# Patient Record
Sex: Male | Born: 1985 | Race: White | Hispanic: No | Marital: Single | Smoking: Never smoker
Health system: Southern US, Community
[De-identification: ages and names within clinical notes are randomized; demographics above are authoritative.]

## PROBLEM LIST (undated history)

## (undated) DIAGNOSIS — S069X9A Unspecified intracranial injury with loss of consciousness of unspecified duration, initial encounter: Secondary | ICD-10-CM

## (undated) DIAGNOSIS — D62 Acute posthemorrhagic anemia: Secondary | ICD-10-CM

## (undated) DIAGNOSIS — F909 Attention-deficit hyperactivity disorder, unspecified type: Secondary | ICD-10-CM

## (undated) DIAGNOSIS — S36039A Unspecified laceration of spleen, initial encounter: Secondary | ICD-10-CM

## (undated) DIAGNOSIS — S020XXA Fracture of vault of skull, initial encounter for closed fracture: Secondary | ICD-10-CM

## (undated) DIAGNOSIS — S069XAA Unspecified intracranial injury with loss of consciousness status unknown, initial encounter: Secondary | ICD-10-CM

## (undated) DIAGNOSIS — I6200 Nontraumatic subdural hemorrhage, unspecified: Secondary | ICD-10-CM

## (undated) HISTORY — DX: Fracture of vault of skull, initial encounter for closed fracture: S02.0XXA

## (undated) HISTORY — DX: Unspecified laceration of spleen, initial encounter: S36.039A

## (undated) HISTORY — DX: Unspecified intracranial injury with loss of consciousness status unknown, initial encounter: S06.9XAA

## (undated) HISTORY — DX: Unspecified intracranial injury with loss of consciousness of unspecified duration, initial encounter: S06.9X9A

## (undated) HISTORY — DX: Attention-deficit hyperactivity disorder, unspecified type: F90.9

## (undated) HISTORY — DX: Nontraumatic subdural hemorrhage, unspecified: I62.00

## (undated) HISTORY — DX: Acute posthemorrhagic anemia: D62

---

## 2005-05-06 ENCOUNTER — Emergency Department: Payer: Self-pay | Admitting: Emergency Medicine

## 2005-06-01 ENCOUNTER — Emergency Department: Payer: Self-pay | Admitting: General Practice

## 2006-07-27 ENCOUNTER — Emergency Department: Payer: Self-pay | Admitting: General Practice

## 2007-12-07 ENCOUNTER — Emergency Department (HOSPITAL_COMMUNITY): Admission: EM | Admit: 2007-12-07 | Discharge: 2007-12-07 | Payer: Self-pay | Admitting: Emergency Medicine

## 2009-06-29 ENCOUNTER — Emergency Department (HOSPITAL_COMMUNITY): Admission: EM | Admit: 2009-06-29 | Discharge: 2009-06-29 | Payer: Self-pay | Admitting: Emergency Medicine

## 2009-07-24 ENCOUNTER — Inpatient Hospital Stay (HOSPITAL_COMMUNITY): Admission: AC | Admit: 2009-07-24 | Discharge: 2009-07-31 | Payer: Self-pay

## 2009-07-30 ENCOUNTER — Ambulatory Visit: Payer: Self-pay | Admitting: Physical Medicine & Rehabilitation

## 2009-07-31 ENCOUNTER — Inpatient Hospital Stay (HOSPITAL_COMMUNITY)
Admission: RE | Admit: 2009-07-31 | Discharge: 2009-08-16 | Payer: Self-pay | Admitting: Physical Medicine & Rehabilitation

## 2009-07-31 ENCOUNTER — Ambulatory Visit: Payer: Self-pay | Admitting: Physical Medicine & Rehabilitation

## 2009-08-11 ENCOUNTER — Ambulatory Visit: Payer: Self-pay | Admitting: Physical Medicine & Rehabilitation

## 2009-08-13 ENCOUNTER — Ambulatory Visit: Payer: Self-pay | Admitting: Psychology

## 2009-09-14 ENCOUNTER — Encounter
Admission: RE | Admit: 2009-09-14 | Discharge: 2009-09-19 | Payer: Self-pay | Admitting: Physical Medicine & Rehabilitation

## 2009-09-19 ENCOUNTER — Ambulatory Visit: Payer: Self-pay | Admitting: Physical Medicine & Rehabilitation

## 2009-10-04 ENCOUNTER — Ambulatory Visit: Payer: Self-pay | Admitting: Psychology

## 2009-12-10 ENCOUNTER — Encounter
Admission: RE | Admit: 2009-12-10 | Discharge: 2009-12-10 | Payer: Self-pay | Admitting: Physical Medicine & Rehabilitation

## 2010-08-18 ENCOUNTER — Encounter: Payer: Self-pay | Admitting: Physical Medicine & Rehabilitation

## 2010-10-10 IMAGING — CR DG CERVICAL SPINE FLEX&EXT ONLY
2 series · 2 of 2 positions shown · non-contrast
Comparison: 07/24/2009.

CLINICAL DATA: 23-year-old male status post trauma with concussion.

CERVICAL SPINE - FLEXION AND EXTENSION VIEWS ONLY

[w c-spine flexion]
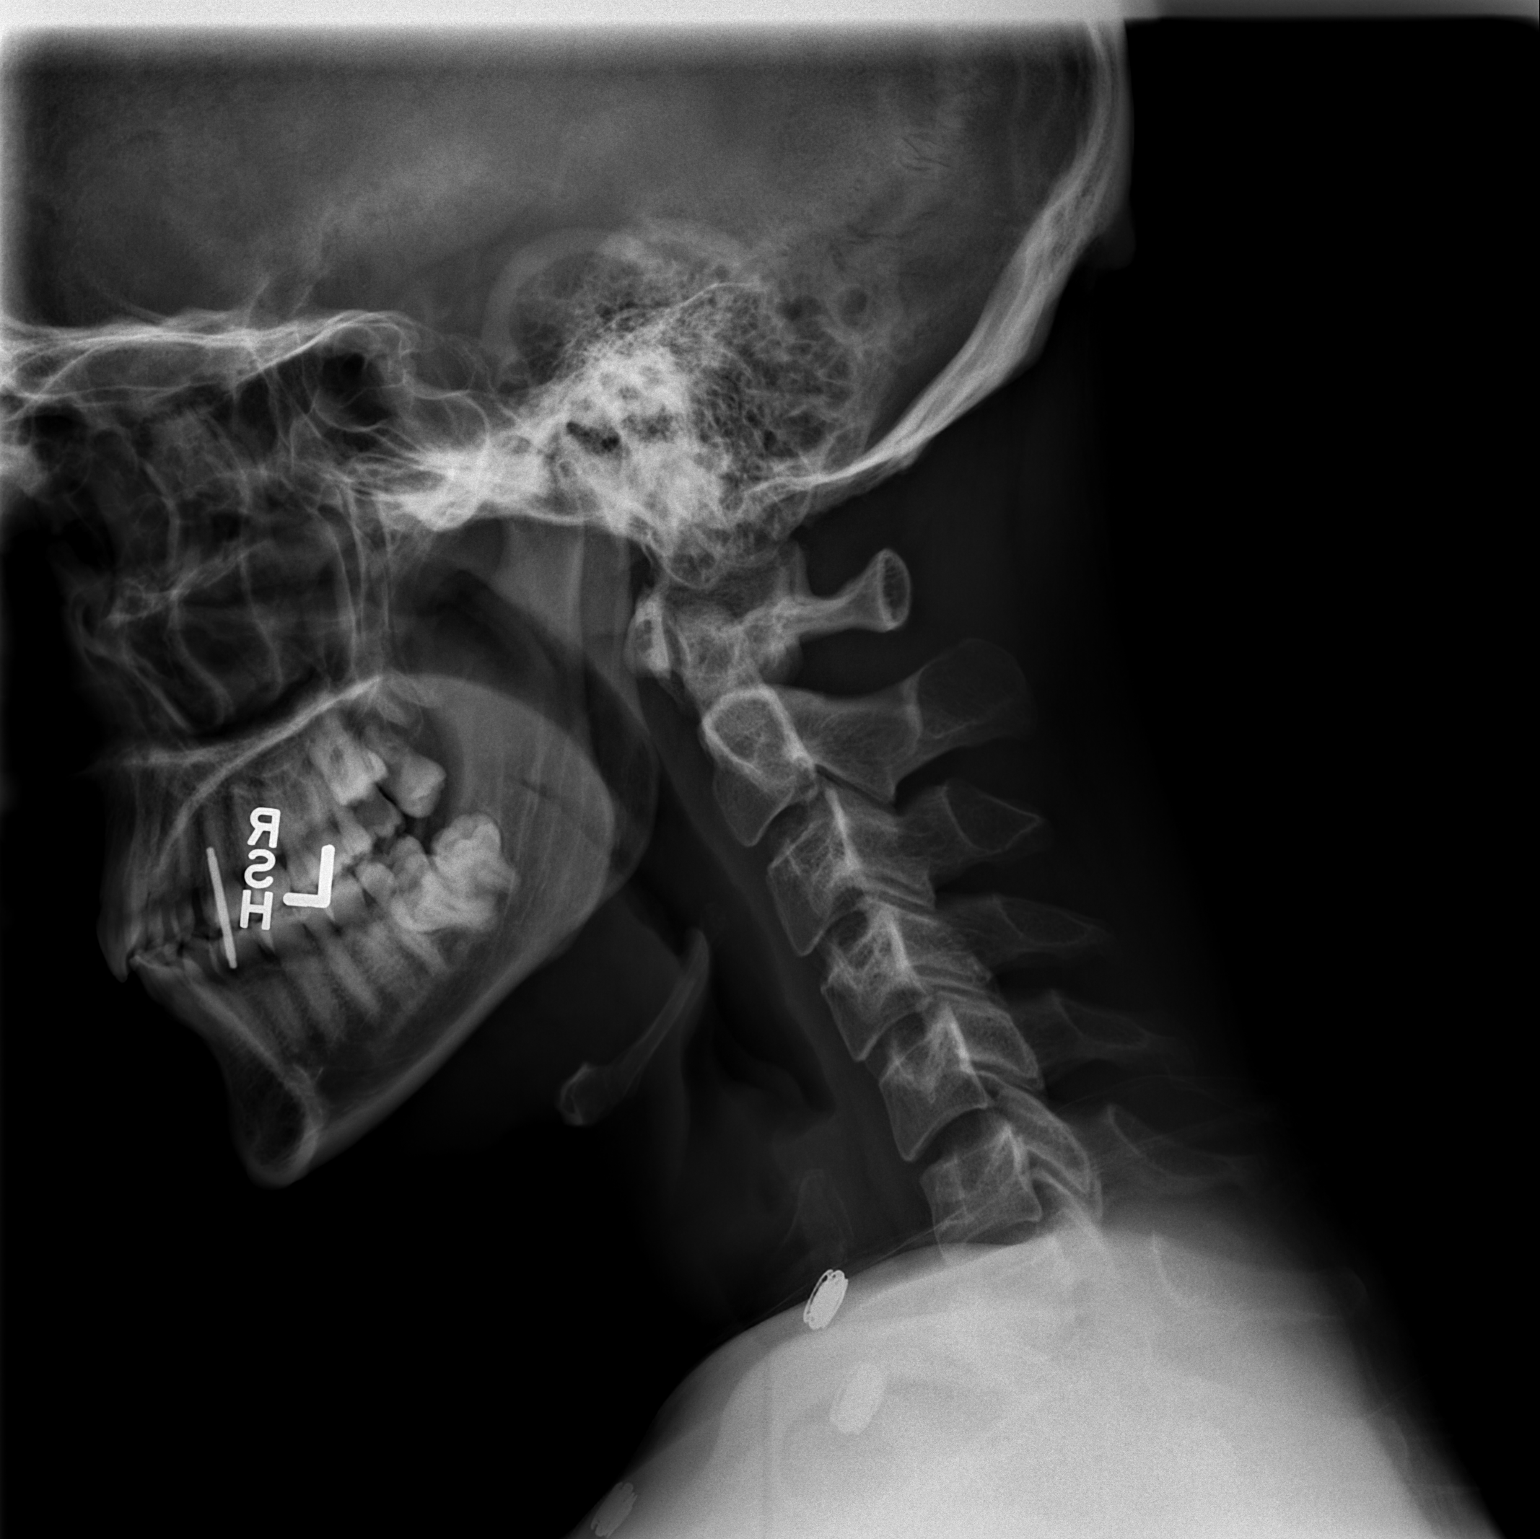

[w c-spine extension]
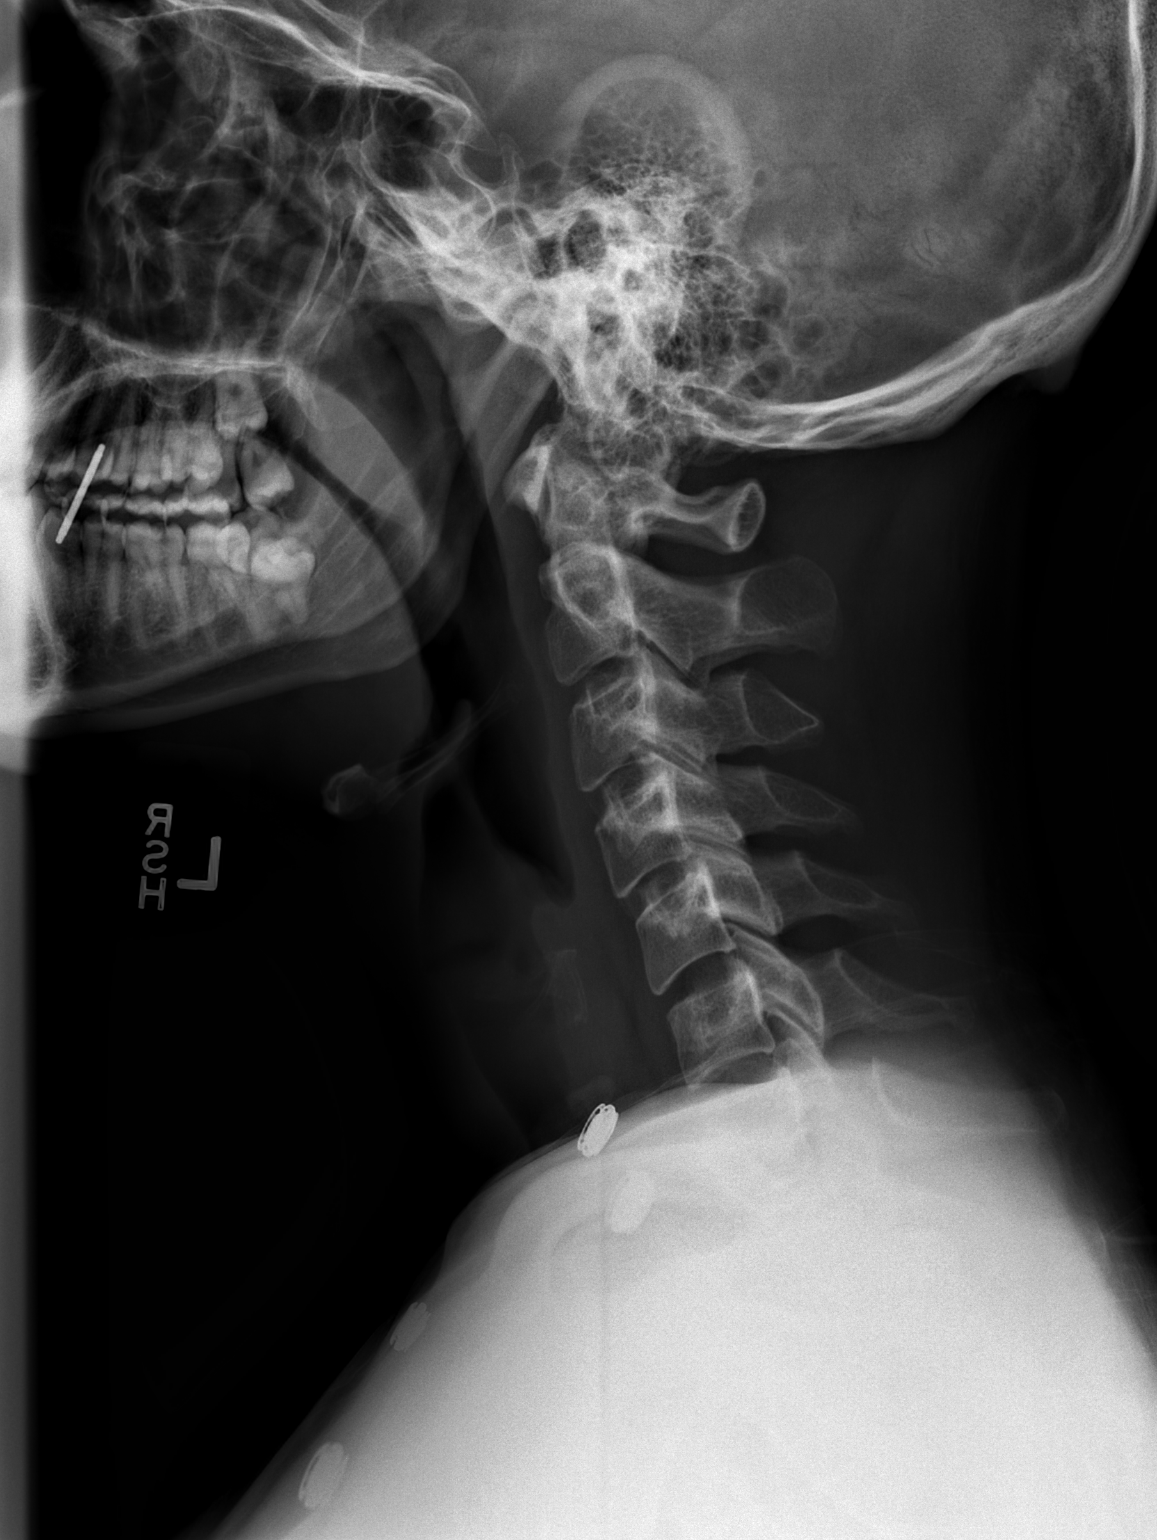

[2 of 2 positions shown; findings below may reference images not displayed]

FINDINGS: Lateral views of the cervical spine in flexion and
extension.  Cervicothoracic junction and not visualized.
Straightening of cervical lordosis.

No significant range of motion in extension is demonstrated.
Decreased range of motion in flexion is noted.  No translational
instability identified.  Prevertebral soft tissues remain normal.
IMPRESSION: 1.  Decreased range of motion in flexion without translational
instability evident.
2.  Lack of any range of motion in neck extension.
3.  Straightening of cervical lordosis.

## 2010-10-12 IMAGING — CR DG CERVICAL SPINE FLEX&EXT ONLY
2 series · 2 of 2 positions shown · non-contrast
Comparison: 07/31/2009.  07/27/2009.

CLINICAL DATA: Trauma.  Pain.

CERVICAL SPINE - FLEXION AND EXTENSION VIEWS ONLY

[w c-spine flexion]
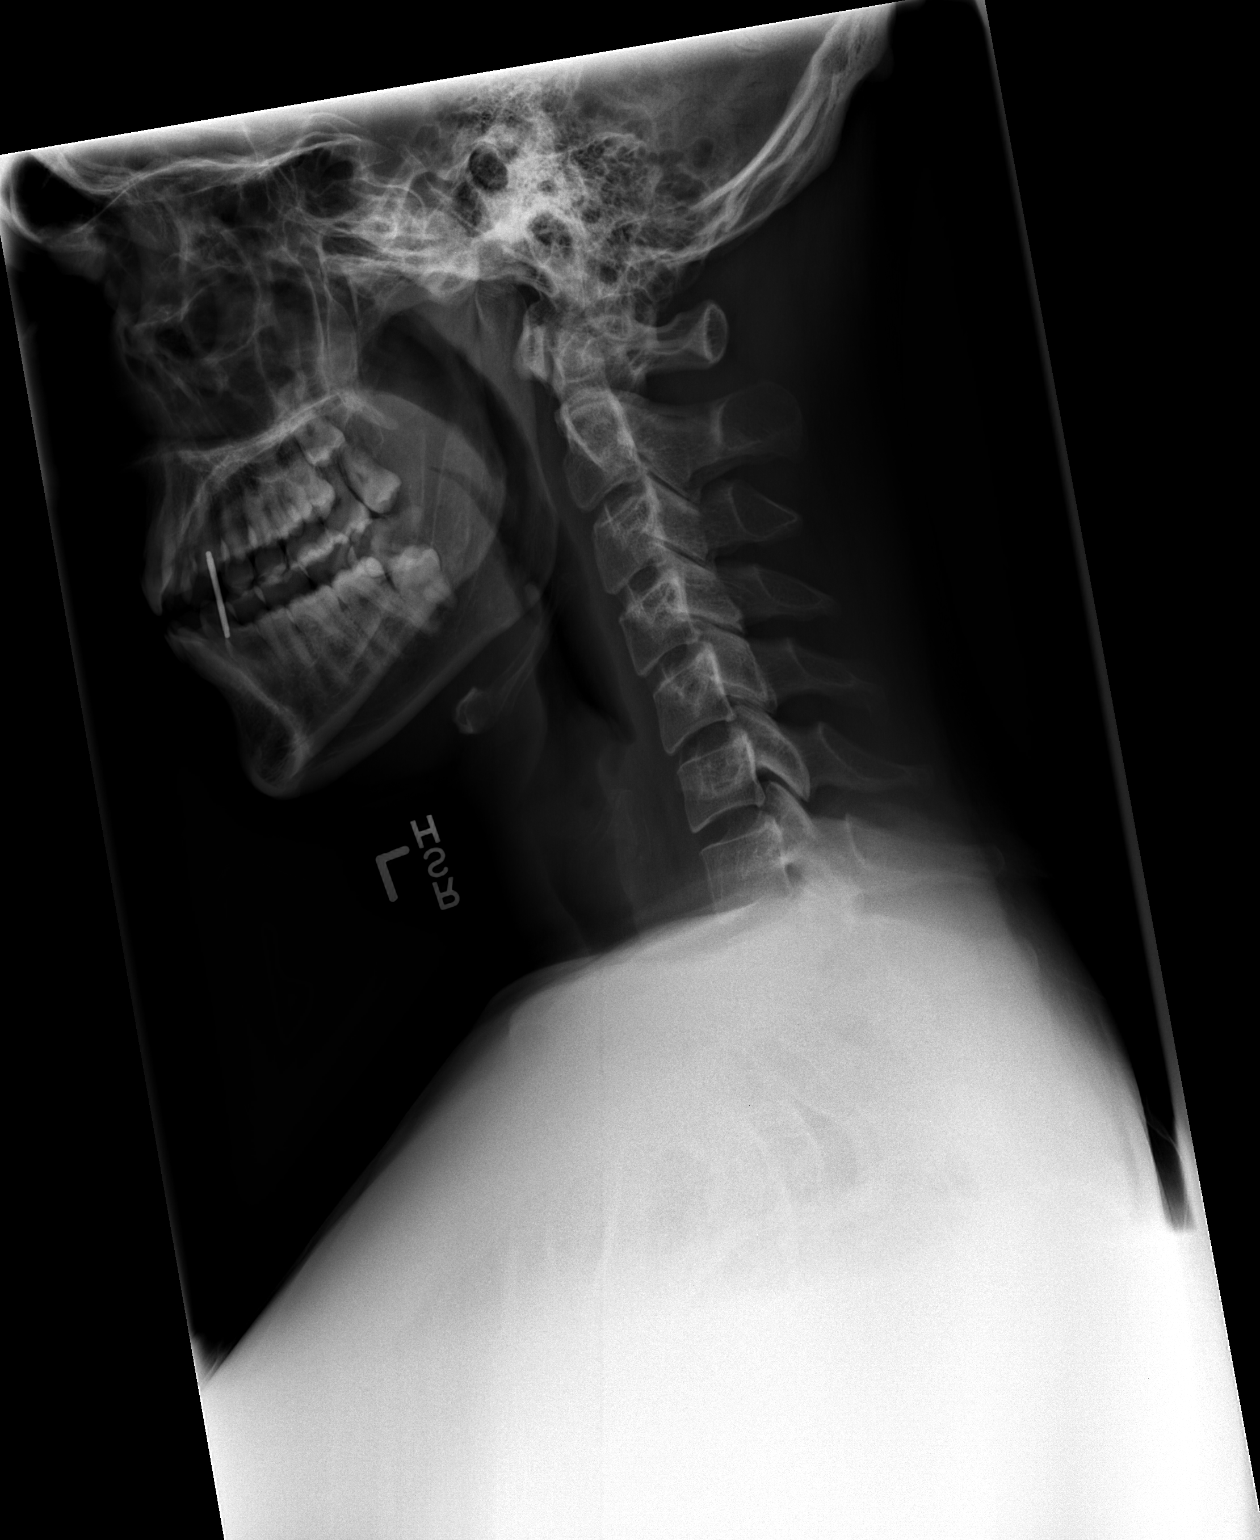

[w c-spine extension]
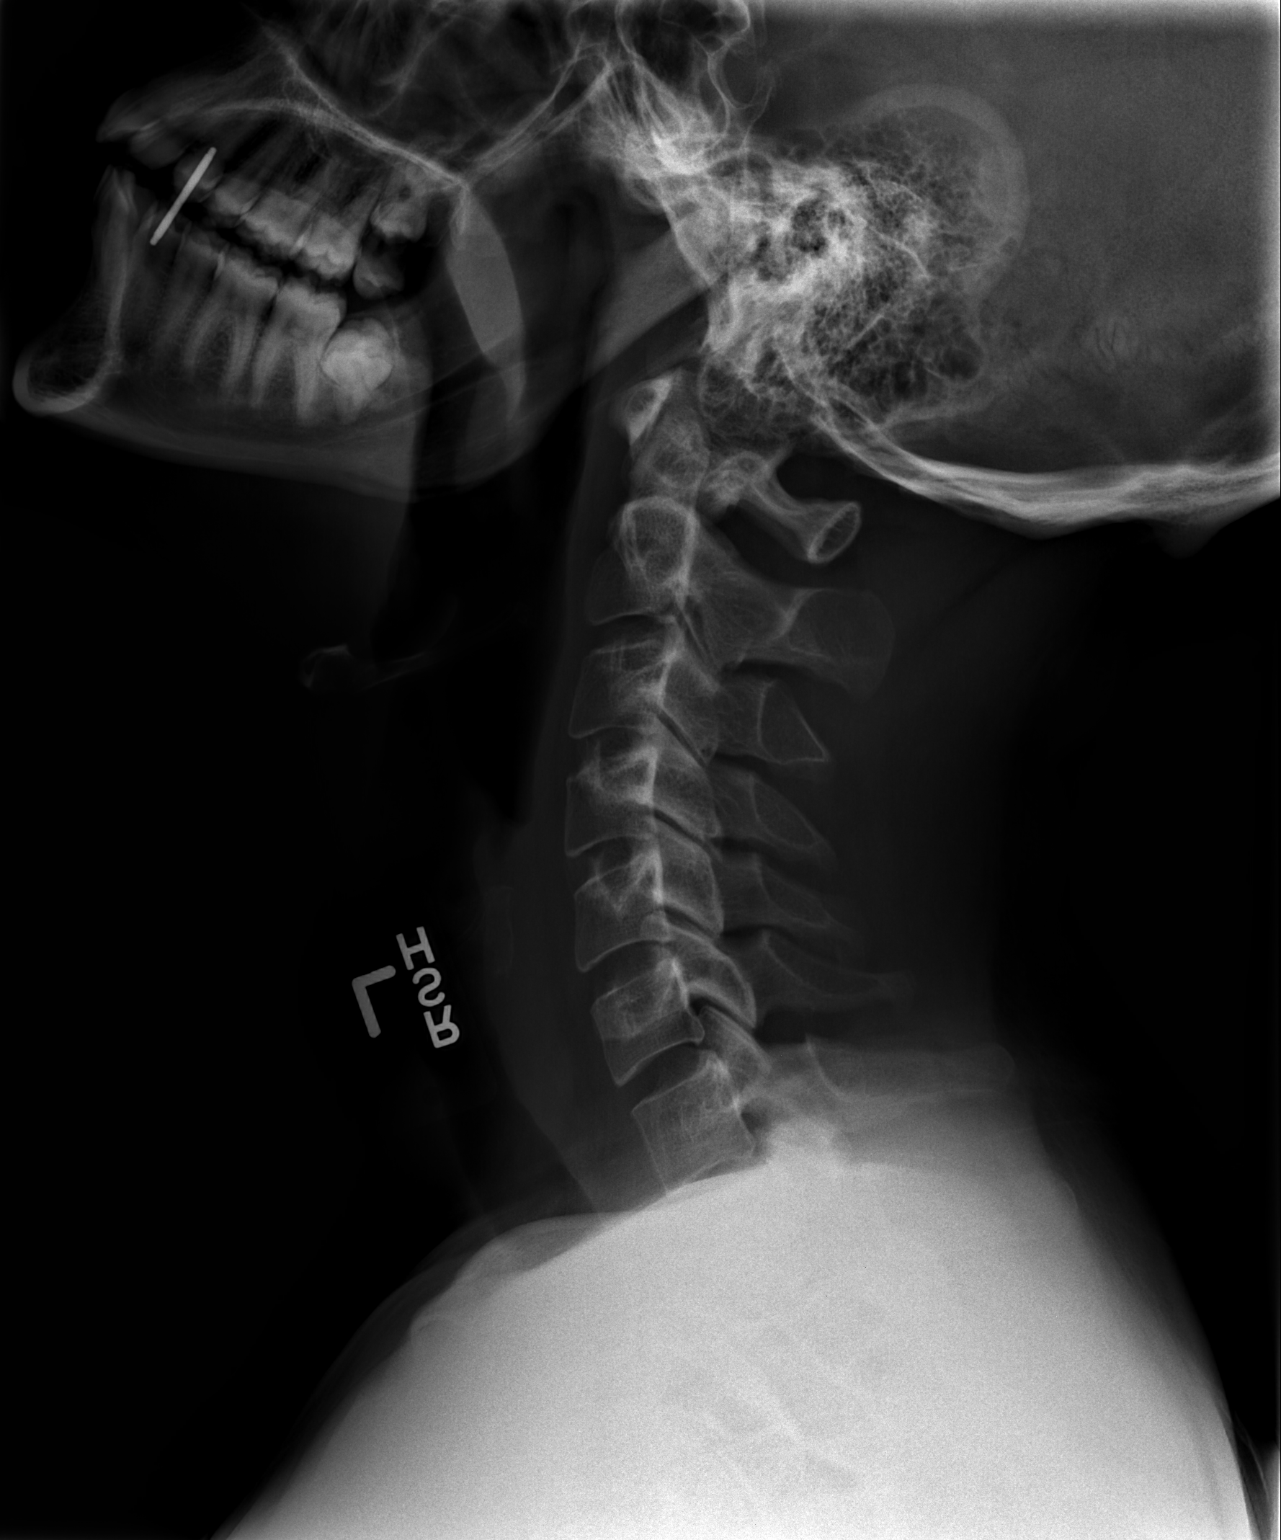

[2 of 2 positions shown; findings below may reference images not displayed]

FINDINGS: Alignment is normal.  Good flexion and extension without
pathologic finding.
IMPRESSION: Normal exam

## 2010-10-13 LAB — CBC
HCT: 39.3 % (ref 39.0–52.0)
HCT: 39.7 % (ref 39.0–52.0)
Hemoglobin: 13.5 g/dL (ref 13.0–17.0)
Hemoglobin: 13.6 g/dL (ref 13.0–17.0)
Hemoglobin: 13.9 g/dL (ref 13.0–17.0)
MCHC: 33.9 g/dL (ref 30.0–36.0)
MCHC: 34.2 g/dL (ref 30.0–36.0)
Platelets: 236 10*3/uL (ref 150–400)
Platelets: 309 10*3/uL (ref 150–400)
RBC: 4.47 MIL/uL (ref 4.22–5.81)
RBC: 4.51 MIL/uL (ref 4.22–5.81)
RDW: 13 % (ref 11.5–15.5)
RDW: 13.2 % (ref 11.5–15.5)
WBC: 6.4 10*3/uL (ref 4.0–10.5)
WBC: 7.3 10*3/uL (ref 4.0–10.5)

## 2010-10-13 LAB — DIFFERENTIAL
Basophils Absolute: 0 10*3/uL (ref 0.0–0.1)
Basophils Relative: 0 % (ref 0–1)
Eosinophils Absolute: 0.3 10*3/uL (ref 0.0–0.7)
Neutro Abs: 4.9 10*3/uL (ref 1.7–7.7)
Neutrophils Relative %: 67 % (ref 43–77)

## 2010-10-13 LAB — GLUCOSE, CAPILLARY
Glucose-Capillary: 108 mg/dL — ABNORMAL HIGH (ref 70–99)
Glucose-Capillary: 117 mg/dL — ABNORMAL HIGH (ref 70–99)
Glucose-Capillary: 97 mg/dL (ref 70–99)
Glucose-Capillary: 98 mg/dL (ref 70–99)

## 2010-10-13 LAB — BASIC METABOLIC PANEL
BUN: 11 mg/dL (ref 6–23)
BUN: 8 mg/dL (ref 6–23)
BUN: 8 mg/dL (ref 6–23)
CO2: 26 mEq/L (ref 19–32)
CO2: 26 mEq/L (ref 19–32)
Calcium: 8.6 mg/dL (ref 8.4–10.5)
Calcium: 9.1 mg/dL (ref 8.4–10.5)
Creatinine, Ser: 0.73 mg/dL (ref 0.4–1.5)
Creatinine, Ser: 0.81 mg/dL (ref 0.4–1.5)
GFR calc Af Amer: >60 mL/min (ref >60)
GFR calc non Af Amer: >60 mL/min (ref >60)
GFR calc non Af Amer: >60 mL/min (ref >60)
Glucose, Bld: 136 mg/dL — ABNORMAL HIGH (ref 70–99)
Glucose, Bld: 97 mg/dL (ref 70–99)
Potassium: 3.7 mEq/L (ref 3.5–5.1)
Potassium: 3.9 mEq/L (ref 3.5–5.1)
Sodium: 137 mEq/L (ref 135–145)
Sodium: 138 mEq/L (ref 135–145)

## 2010-10-13 LAB — COMPREHENSIVE METABOLIC PANEL
ALT: 34 U/L (ref 0–53)
Alkaline Phosphatase: 56 U/L (ref 39–117)
BUN: 8 mg/dL (ref 6–23)
CO2: 26 mEq/L (ref 19–32)
GFR calc non Af Amer: >60 mL/min (ref >60)
Glucose, Bld: 126 mg/dL — ABNORMAL HIGH (ref 70–99)
Potassium: 3.6 mEq/L (ref 3.5–5.1)
Sodium: 140 mEq/L (ref 135–145)
Total Bilirubin: 0.3 mg/dL (ref 0.3–1.2)

## 2010-10-28 LAB — BASIC METABOLIC PANEL
BUN: 5 mg/dL — ABNORMAL LOW (ref 6–23)
BUN: 8 mg/dL (ref 6–23)
CO2: 23 mEq/L (ref 19–32)
CO2: 26 mEq/L (ref 19–32)
Calcium: 8.2 mg/dL — ABNORMAL LOW (ref 8.4–10.5)
Calcium: 8.2 mg/dL — ABNORMAL LOW (ref 8.4–10.5)
Calcium: 8.4 mg/dL (ref 8.4–10.5)
Chloride: 111 mEq/L (ref 96–112)
Creatinine, Ser: 0.76 mg/dL (ref 0.4–1.5)
Creatinine, Ser: 0.93 mg/dL (ref 0.4–1.5)
Creatinine, Ser: 0.93 mg/dL (ref 0.4–1.5)
GFR calc Af Amer: >60 mL/min (ref >60)
GFR calc Af Amer: >60 mL/min (ref >60)
GFR calc Af Amer: >60 mL/min (ref >60)
GFR calc Af Amer: >60 mL/min (ref >60)
GFR calc non Af Amer: >60 mL/min (ref >60)
Glucose, Bld: 83 mg/dL (ref 70–99)
Potassium: 3.9 mEq/L (ref 3.5–5.1)
Sodium: 139 mEq/L (ref 135–145)
Sodium: 141 mEq/L (ref 135–145)

## 2010-10-28 LAB — CULTURE, BLOOD (ROUTINE X 2)

## 2010-10-28 LAB — BLOOD GAS, ARTERIAL
Acid-base deficit: 3 mmol/L — ABNORMAL HIGH (ref 0.0–2.0)
Bicarbonate: 21.1 mEq/L (ref 20.0–24.0)
FIO2: 0.4 %
FIO2: 0.6 %
MECHVT: 550 mL
MECHVT: 550 mL
O2 Saturation: 99.2 %
O2 Saturation: 99.5 %
PEEP: 5 cmH2O
Patient temperature: 98.6
Patient temperature: 98.6
RATE: 12 resp/min
TCO2: 22.3 mmol/L (ref 0–100)
TCO2: 24.7 mmol/L (ref 0–100)
pCO2 arterial: 45.9 mmHg — ABNORMAL HIGH (ref 35.0–45.0)
pH, Arterial: 7.312 — ABNORMAL LOW (ref 7.350–7.450)

## 2010-10-28 LAB — TYPE AND SCREEN: Antibody Screen: NEGATIVE

## 2010-10-28 LAB — CBC
HCT: 40.5 % (ref 39.0–52.0)
HCT: 50.2 % (ref 39.0–52.0)
Hemoglobin: 12.4 g/dL — ABNORMAL LOW (ref 13.0–17.0)
MCHC: 34.3 g/dL (ref 30.0–36.0)
MCHC: 35.4 g/dL (ref 30.0–36.0)
MCV: 88.1 fL (ref 78.0–100.0)
MCV: 88.2 fL (ref 78.0–100.0)
MCV: 89.4 fL (ref 78.0–100.0)
Platelets: 137 10*3/uL — ABNORMAL LOW (ref 150–400)
Platelets: 167 10*3/uL (ref 150–400)
Platelets: 310 10*3/uL (ref 150–400)
RBC: 3.93 MIL/uL — ABNORMAL LOW (ref 4.22–5.81)
RBC: 4.14 MIL/uL — ABNORMAL LOW (ref 4.22–5.81)
RBC: 4.15 MIL/uL — ABNORMAL LOW (ref 4.22–5.81)
RBC: 4.59 MIL/uL (ref 4.22–5.81)
RDW: 13 % (ref 11.5–15.5)
RDW: 13.8 % (ref 11.5–15.5)
RDW: 14.4 % (ref 11.5–15.5)
WBC: 7.8 10*3/uL (ref 4.0–10.5)
WBC: 8.9 10*3/uL (ref 4.0–10.5)

## 2010-10-28 LAB — DIFFERENTIAL
Lymphocytes Relative: 12 % (ref 12–46)
Monocytes Absolute: 0.6 10*3/uL (ref 0.1–1.0)
Monocytes Relative: 8 % (ref 3–12)
Neutro Abs: 6.1 10*3/uL (ref 1.7–7.7)
Neutrophils Relative %: 78 % — ABNORMAL HIGH (ref 43–77)

## 2010-10-28 LAB — EXPECTORATED SPUTUM ASSESSMENT W GRAM STAIN, RFLX TO RESP C

## 2010-10-28 LAB — COMPREHENSIVE METABOLIC PANEL
Albumin: 4.2 g/dL (ref 3.5–5.2)
BUN: 10 mg/dL (ref 6–23)
Creatinine, Ser: 1.14 mg/dL (ref 0.4–1.5)
Total Protein: 6.8 g/dL (ref 6.0–8.3)

## 2010-10-28 LAB — ETHANOL: Alcohol, Ethyl (B): <5 mg/dL (ref 0–10)

## 2010-10-28 LAB — APTT: aPTT: 30 seconds (ref 24–37)

## 2010-10-28 LAB — URINE CULTURE

## 2010-10-28 LAB — PROTIME-INR: INR: 1.08 (ref 0.00–1.49)

## 2011-08-25 DIAGNOSIS — S069X9A Unspecified intracranial injury with loss of consciousness of unspecified duration, initial encounter: Secondary | ICD-10-CM | POA: Insufficient documentation

## 2011-08-25 DIAGNOSIS — D62 Acute posthemorrhagic anemia: Secondary | ICD-10-CM

## 2011-08-25 DIAGNOSIS — I6200 Nontraumatic subdural hemorrhage, unspecified: Secondary | ICD-10-CM

## 2011-08-25 DIAGNOSIS — S069XAA Unspecified intracranial injury with loss of consciousness status unknown, initial encounter: Secondary | ICD-10-CM

## 2011-08-26 ENCOUNTER — Encounter: Payer: 59 | Attending: Physical Medicine & Rehabilitation | Admitting: Physical Medicine & Rehabilitation

## 2011-08-26 DIAGNOSIS — Z87820 Personal history of traumatic brain injury: Secondary | ICD-10-CM | POA: Insufficient documentation

## 2011-08-26 DIAGNOSIS — H539 Unspecified visual disturbance: Secondary | ICD-10-CM | POA: Insufficient documentation

## 2011-08-26 DIAGNOSIS — J45909 Unspecified asthma, uncomplicated: Secondary | ICD-10-CM | POA: Insufficient documentation

## 2011-08-26 NOTE — Assessment & Plan Note (Signed)
CHIEF COMPLAINT:  Intermittent visual deficits.  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  He also had a left fibular fracture.  I had a signed off on him in 2011.  He has returned to work.  I have seen him intermittently since then.  Adam Newton now complains of intermittent scenarios where he goes "blind."  All he sees is white light.  He states that it was happening once a month or two.  Now over the last few weeks he knows that it is happening twice a week.  He states that he has tightening sensation in his low back, sometimes before it happens, but he states it is hard to predict otherwise.  He does note that he was sitting at work when this happens.  He denies any other visual phenomenon except perhaps a few floaters.  He denies frank headache. Usually when he stands up he will take a few steps with the symptoms in place and then the symptoms resolve.  He notes any new weakness.  He continues to work full time at his job which is third shift.  He states that he does struggle somewhat to keep up with this at work but has been working at his employer for some time.  He has lab work currently.  He states that he is up and down at his desk a lot.  He does report his low back pain is about a 1/10, but it is very mild.  His sleep is fair and sometimes it will challenge because of the shift he works.  REVIEW OF SYSTEMS:  Notable for the above.  He does have occasional wheezing and shortness of breath.  PAST MEDICAL HISTORY:  Notable for asthma and the brain injury above. He denies history of hypertension.  SOCIAL HISTORY:  The patient is single, living with a roommate.  He is smoking a quarter pack of cigarettes per day.  FAMILY HISTORY:  Unremarkable.  CURRENT MEDICATIONS:  Albuterol inhaler p.r.n.  ALLERGIES:  None.  PHYSICAL EXAMINATION:  VITAL SIGNS:  Blood  pressure is 146/84, pulse is 100, respiratory rate is 16, he is saturating 96% on room air. GENERAL:  The patient is pleasant, alert, oriented x3.  Affect is generally bright and appropriate.  Socially, he was quite appropriate. NEUROLOGIC:  Cranial nerve exam was unremarkable with full visual fields intact on my examination today.  He has good visual acuity. Cognitively, he is appropriate with good insight and awareness.  He may be a bit slow to attend but certainly is functional with that.  His strength is 5/5 in all 4 limbs with 1+ reflexes throughout.  Sensory exam is grossly intact. MUSCULOSKELETAL:  He has good range of motion.  He is able to only bend, however, at the waist to about 90 degrees and has hard time reaching his toes.  Neck range of motion was normal. HEART:  Regular. CHEST:  Clear. ABDOMEN:  Soft AND nontender.  ASSESSMENT: 1. History of left frontoparietal traumatic brain injury. 2. Recent episodes of "blindness" where the patient sees all white for     a few seconds.  These have been happening twice a week recently.  PLAN: 1. There is the possibility that these visual phenomenon could be     related to his brain injury and could be seizure-type phenomenon or     migraine-type phenomenon.  I would like  to get an MRI of the brain     without contrast to evaluate for a potential source which could be     triggering these types of symptoms. 2. Consider neuro ophthalmology evaluation as well. 3. The patient asked about his hypertension.  I doubt given his blood     pressure today and lack of other symptoms that this is blood     pressure related issue. 4. Cannot rule out anxiety syndrome.  There is a distinct possibility     that these are non-physiologic. The patient does state that he     feels he is under pressure to perform at his current job and feels     that he's not meeting expectations. 5. I will see the patient back in about a month.  He will call me  with     any problems or questions in the meantime.     Ranelle Oyster, M.D. Electronically Signed    ZTS/MedQ D:  08/26/2011 09:49:42  T:  08/26/2011 11:43:08  Job #:  [id number]

## 2011-09-18 ENCOUNTER — Other Ambulatory Visit: Payer: Self-pay | Admitting: Physical Medicine & Rehabilitation

## 2011-09-18 DIAGNOSIS — S069X9A Unspecified intracranial injury with loss of consciousness of unspecified duration, initial encounter: Secondary | ICD-10-CM

## 2011-09-24 ENCOUNTER — Inpatient Hospital Stay (HOSPITAL_COMMUNITY)
Admission: RE | Admit: 2011-09-24 | Discharge: 2011-09-24 | Payer: 59 | Source: Ambulatory Visit | Attending: Physical Medicine & Rehabilitation | Admitting: Physical Medicine & Rehabilitation

## 2011-09-26 ENCOUNTER — Encounter: Payer: 59 | Attending: Physical Medicine & Rehabilitation | Admitting: Physical Medicine & Rehabilitation

## 2011-09-26 DIAGNOSIS — J45909 Unspecified asthma, uncomplicated: Secondary | ICD-10-CM | POA: Insufficient documentation

## 2011-09-26 DIAGNOSIS — H539 Unspecified visual disturbance: Secondary | ICD-10-CM | POA: Insufficient documentation

## 2011-09-26 DIAGNOSIS — Z87820 Personal history of traumatic brain injury: Secondary | ICD-10-CM | POA: Insufficient documentation

## 2011-10-16 ENCOUNTER — Emergency Department (HOSPITAL_BASED_OUTPATIENT_CLINIC_OR_DEPARTMENT_OTHER)
Admission: EM | Admit: 2011-10-16 | Discharge: 2011-10-16 | Disposition: A | Discharge status: Home / Self Care | Payer: 59 | Attending: Emergency Medicine | Admitting: Emergency Medicine

## 2011-10-16 ENCOUNTER — Encounter (HOSPITAL_BASED_OUTPATIENT_CLINIC_OR_DEPARTMENT_OTHER): Payer: Self-pay | Admitting: *Deleted

## 2011-10-16 DIAGNOSIS — R0602 Shortness of breath: Secondary | ICD-10-CM | POA: Insufficient documentation

## 2011-10-16 DIAGNOSIS — J45901 Unspecified asthma with (acute) exacerbation: Secondary | ICD-10-CM

## 2011-10-16 DIAGNOSIS — F909 Attention-deficit hyperactivity disorder, unspecified type: Secondary | ICD-10-CM | POA: Insufficient documentation

## 2011-10-16 MED ORDER — ALBUTEROL SULFATE HFA 108 (90 BASE) MCG/ACT IN AERS: 1.0000 | INHALATION_SPRAY | Freq: Four times a day (QID) | RESPIRATORY_TRACT | Status: DC | PRN

## 2011-10-16 MED ORDER — ALBUTEROL SULFATE (5 MG/ML) 0.5% IN NEBU
5.0000 mg | INHALATION_SOLUTION | Freq: Once | RESPIRATORY_TRACT | Status: AC
  Administered 2011-10-16: 5 mg via RESPIRATORY_TRACT
  Filled 2011-10-16: qty 1

## 2011-10-16 MED ORDER — ALBUTEROL SULFATE HFA 108 (90 BASE) MCG/ACT IN AERS
2.0000 | INHALATION_SPRAY | RESPIRATORY_TRACT | Status: DC | PRN
  Administered 2011-10-16: 2 via RESPIRATORY_TRACT
  Filled 2011-10-16: qty 6.7

## 2011-10-16 MED ORDER — PREDNISONE 20 MG PO TABS: 60.0000 mg | ORAL_TABLET | Freq: Every day | ORAL | Status: AC

## 2011-10-16 MED ORDER — ALBUTEROL SULFATE (5 MG/ML) 0.5% IN NEBU
5.0000 mg | INHALATION_SOLUTION | Freq: Once | RESPIRATORY_TRACT | Status: AC
  Administered 2011-10-16: 5 mg via RESPIRATORY_TRACT

## 2011-10-16 MED ORDER — PREDNISONE 50 MG PO TABS
60.0000 mg | ORAL_TABLET | Freq: Once | ORAL | Status: AC
  Administered 2011-10-16: 60 mg via ORAL
  Filled 2011-10-16: qty 1

## 2011-10-16 NOTE — ED Provider Notes (Signed)
History     CSN: [id number]  Arrival date & time 10/16/11  0246   First MD Initiated Contact with Patient 10/16/11 [phone]      Chief Complaint  Patient presents with  . Asthma    (Consider location/radiation/quality/duration/timing/severity/associated sxs/prior treatment) Patient is a 26 y.o. male presenting with asthma. The history is provided by the patient.  Asthma This is a recurrent problem. The current episode started 1 to 2 hours ago. The problem occurs constantly. The problem has been gradually worsening. Associated symptoms include shortness of breath. Pertinent negatives include no chest pain, no abdominal pain and no headaches. Exacerbated by: Multiple known triggers including change of seasons he attributes symptoms to tonight. The symptoms are relieved by nothing. Treatments tried: Albuterol inhaler at home with no relief.   patient with long-standing history of asthma and complains of asthma attack tonight. Nonproductive cough. No recent fevers or chills or illness otherwise. Symptoms feel like a typical severe asthma attack. No pain or radiation.  Past Medical History  Diagnosis Date  . Asthma   . ADHD (attention deficit hyperactivity disorder)   . TBI (traumatic brain injury)   . Subdural hemorrhage   . right frontal calvarial fracture extending to right sinuses   . Splenic laceration   . Acute blood loss anemia     History reviewed. No pertinent past surgical history.  Family History  Problem Relation Age of Onset  . Diabetes    . Coronary artery disease      History  Substance Use Topics  . Smoking status: Current Everyday Smoker  . Smokeless tobacco: Not on file  . Alcohol Use: No      Review of Systems  Constitutional: Negative for fever and chills.  HENT: Negative for neck pain and neck stiffness.   Eyes: Negative for pain.  Respiratory: Positive for shortness of breath and wheezing.   Cardiovascular: Negative for chest pain.  Gastrointestinal:  Negative for abdominal pain.  Genitourinary: Negative for dysuria.  Musculoskeletal: Negative for back pain.  Skin: Negative for rash.  Neurological: Negative for headaches.  All other systems reviewed and are negative.    Allergies  Penicillins; Ritalin; and Sulfa antibiotics  Home Medications   Current Outpatient Rx  Name Route Sig Dispense Refill  . ALBUTEROL IN Inhalation Inhale into the lungs.    . AMANTADINE HCL 100 MG PO CAPS Oral Take 100 mg by mouth 2 (two) times daily.    Marland Kitchen GABAPENTIN PO Oral Take by mouth.      BP 125/66  Pulse 130  Resp 22  SpO2 97%  Physical Exam  Constitutional: He is oriented to person, place, and time. He appears well-developed and well-nourished.  HENT:  Head: Normocephalic and atraumatic.  Eyes: Conjunctivae and EOM are normal. Pupils are equal, round, and reactive to light.  Neck: Trachea normal. Neck supple. No thyromegaly present.  Cardiovascular: Normal rate, regular rhythm, S1 normal, S2 normal and normal pulses.     No systolic murmur is present   No diastolic murmur is present  Pulses:      Radial pulses are 2+ on the right side, and 2+ on the left side.  Pulmonary/Chest: He has no rhonchi.       Decreased bilateral breath sounds with inspiratory and expiratory wheezes. Significant tachypnea and moderate respiratory distress  Abdominal: Soft. Normal appearance and bowel sounds are normal. There is no tenderness. There is no CVA tenderness and negative Murphy's sign.  Musculoskeletal:  BLE:s Calves nontender, no cords or erythema, negative Homans sign  Neurological: He is alert and oriented to person, place, and time. He has normal strength. No cranial nerve deficit or sensory deficit. GCS eye subscore is 4. GCS verbal subscore is 5. GCS motor subscore is 6.  Skin: Skin is warm and dry. No rash noted. He is not diaphoretic.  Psychiatric: His speech is normal.       Cooperative and appropriate    ED Course  Procedures  (including critical care time)  After initial assessment given albuterol nebulizer breathing treatment with steroids.  Recheck after 40 minutes, though somewhat improved but with persistent symptoms, and still has significant wheezing and repeat breathing test ordered  Recheck after second breathing treatment, continues to improve still some wheezing and the third breathing treatment ordered with inhaler provided.   Recheck again at 5:30 AM, is now feeling much better with wheezing resolved and patient requesting to be discharged home. Pulse ox room air 95% is adequate. Respiratory distress resolved is no longer tachypneic.  CRITICAL CARE Performed by: Sunnie Nielsen   Total critical care time: 35  Critical care time was exclusive of separately billable procedures and treating other patients.  Critical care was necessary to treat or prevent imminent or life-threatening deterioration.  Critical care was time spent personally by me on the following activities: development of treatment plan with patient and/or surrogate as well as nursing, evaluation of patient's response to treatment, examination of patient, obtaining history from patient or surrogate - friend bedside, ordering and performing treatments and interventions, pulse oximetry and re-evaluation of patient's condition.    MDM   Acute asthma attack with significant wheezing and respiratory distress on arrival, improved after steroids and multiple breathing treatments, with serial assessments as above. Reliable historian verbalizes understanding precautions and strict discharge and followup instructions. Prescription for albuterol and prednisone provided.         Sunnie Nielsen, MD 10/16/11 0530

## 2011-10-16 NOTE — ED Notes (Signed)
C/o asthma attack x 2 days but worsened tonight, has been using inhaler without relief

## 2011-10-16 NOTE — Discharge Instructions (Signed)

## 2011-10-16 NOTE — ED Notes (Signed)
RT at bs upon pt arrival, neb tx in progress.

## 2012-01-27 ENCOUNTER — Emergency Department (HOSPITAL_BASED_OUTPATIENT_CLINIC_OR_DEPARTMENT_OTHER)
Admission: EM | Admit: 2012-01-27 | Discharge: 2012-01-27 | Disposition: A | Discharge status: Home / Self Care | Payer: 59 | Attending: Emergency Medicine | Admitting: Emergency Medicine

## 2012-01-27 ENCOUNTER — Encounter (HOSPITAL_BASED_OUTPATIENT_CLINIC_OR_DEPARTMENT_OTHER): Payer: Self-pay | Admitting: *Deleted

## 2012-01-27 DIAGNOSIS — J45901 Unspecified asthma with (acute) exacerbation: Secondary | ICD-10-CM | POA: Insufficient documentation

## 2012-01-27 DIAGNOSIS — R0602 Shortness of breath: Secondary | ICD-10-CM | POA: Insufficient documentation

## 2012-01-27 MED ORDER — PREDNISONE 20 MG PO TABS
40.0000 mg | ORAL_TABLET | Freq: Once | ORAL | Status: AC
  Administered 2012-01-27: 40 mg via ORAL

## 2012-01-27 MED ORDER — ALBUTEROL SULFATE HFA 108 (90 BASE) MCG/ACT IN AERS
1.0000 | INHALATION_SPRAY | Freq: Four times a day (QID) | RESPIRATORY_TRACT | Status: DC
  Filled 2012-01-27: qty 6.7

## 2012-01-27 MED ORDER — IPRATROPIUM BROMIDE 0.02 % IN SOLN
0.5000 mg | Freq: Once | RESPIRATORY_TRACT | Status: AC
  Administered 2012-01-27: 0.5 mg via RESPIRATORY_TRACT

## 2012-01-27 MED ORDER — ALBUTEROL SULFATE (5 MG/ML) 0.5% IN NEBU
5.0000 mg | INHALATION_SOLUTION | Freq: Once | RESPIRATORY_TRACT | Status: AC
  Administered 2012-01-27: 5 mg via RESPIRATORY_TRACT

## 2012-01-27 MED ORDER — PREDNISONE 20 MG PO TABS
ORAL_TABLET | ORAL | Status: AC
  Administered 2012-01-27: 40 mg via ORAL
  Filled 2012-01-27: qty 2

## 2012-01-27 MED ORDER — IPRATROPIUM BROMIDE 0.02 % IN SOLN
RESPIRATORY_TRACT | Status: AC
  Administered 2012-01-27: 0.5 mg via RESPIRATORY_TRACT
  Filled 2012-01-27: qty 2.5

## 2012-01-27 MED ORDER — ALBUTEROL SULFATE (2.5 MG/3ML) 0.083% IN NEBU: 2.5000 mg | INHALATION_SOLUTION | Freq: Four times a day (QID) | RESPIRATORY_TRACT | Status: AC | PRN

## 2012-01-27 MED ORDER — PREDNISONE 10 MG PO TABS: 20.0000 mg | ORAL_TABLET | Freq: Two times a day (BID) | ORAL | Status: DC

## 2012-01-27 MED ORDER — ALBUTEROL SULFATE (5 MG/ML) 0.5% IN NEBU
INHALATION_SOLUTION | RESPIRATORY_TRACT | Status: AC
  Administered 2012-01-27: 5 mg via RESPIRATORY_TRACT
  Filled 2012-01-27: qty 1

## 2012-01-27 NOTE — ED Notes (Signed)
Pt has hx asthma. Was exposed to a friends cat, which he thinks triggered the attack. Pt c/o nasal drainage, post nasal drip and cough with tightness and wheezing. Progressively worsening.

## 2012-01-27 NOTE — ED Provider Notes (Signed)
History     CSN: [id number]  Arrival date & time 01/27/12  4098   First MD Initiated Contact with Patient 01/27/12 [phone]      Chief Complaint  Patient presents with  . Asthma    (Consider location/radiation/quality/duration/timing/severity/associated sxs/prior treatment) Patient is a 26 y.o. male presenting with asthma. The history is provided by the patient.  Asthma This is a recurrent problem. The current episode started yesterday. The problem occurs constantly. The problem has been gradually worsening. Associated symptoms include shortness of breath. Pertinent negatives include no chest pain. Exacerbated by: exposure to friend's cat. Nothing relieves the symptoms. Treatments tried: inhaler. The treatment provided mild relief.    Past Medical History  Diagnosis Date  . Asthma   . ADHD (attention deficit hyperactivity disorder)   . TBI (traumatic brain injury)   . Subdural hemorrhage   . right frontal calvarial fracture extending to right sinuses   . Splenic laceration   . Acute blood loss anemia     History reviewed. No pertinent past surgical history.  Family History  Problem Relation Age of Onset  . Diabetes    . Coronary artery disease      History  Substance Use Topics  . Smoking status: Current Everyday Smoker  . Smokeless tobacco: Not on file  . Alcohol Use: No      Review of Systems  Respiratory: Positive for shortness of breath.   Cardiovascular: Negative for chest pain.  All other systems reviewed and are negative.    Allergies  Penicillins; Ritalin; and Sulfa antibiotics  Home Medications   Current Outpatient Rx  Name Route Sig Dispense Refill  . ALBUTEROL SULFATE HFA 108 (90 BASE) MCG/ACT IN AERS Inhalation Inhale 1-2 puffs into the lungs every 6 (six) hours as needed for wheezing. 1 Inhaler 0  . ALBUTEROL IN Inhalation Inhale into the lungs.    . AMANTADINE HCL 100 MG PO CAPS Oral Take 100 mg by mouth 2 (two) times daily.    Marland Kitchen GABAPENTIN PO  Oral Take by mouth.      BP 134/85  Pulse 104  Temp 98.8 F (37.1 C) (Oral)  Resp 20  SpO2 97%  Physical Exam  Nursing note and vitals reviewed. Constitutional: He is oriented to person, place, and time. He appears well-developed and well-nourished. No distress.  HENT:  Head: Normocephalic and atraumatic.  Mouth/Throat: Oropharynx is clear and moist.  Neck: Normal range of motion. Neck supple.  Cardiovascular: Normal rate and regular rhythm.   Pulmonary/Chest: Effort normal. No respiratory distress.       Slight wheezes are audible bilaterally.  Musculoskeletal: Normal range of motion. He exhibits no edema.  Neurological: He is alert and oriented to person, place, and time.  Skin: Skin is warm and dry. He is not diaphoretic.    ED Course  Procedures (including critical care time)  Labs Reviewed - No data to display No results found.   No diagnosis found.    MDM  Two nebs and prednisone given and the patient is feeling better.  Will discharge with continued albuterol and prednisone.          Geoffery Lyons, MD 01/27/12 [phone]

## 2012-01-27 NOTE — Discharge Instructions (Signed)
Asthma, Acute Bronchospasm  Your exam shows you have asthma, or acute bronchospasm that acts like asthma. Bronchospasm means your air passages become narrowed. These conditions are due to inflammation and airway spasm that cause narrowing of the bronchial tubes in the lungs. This causes you to have wheezing and shortness of breath.  CAUSES    Respiratory infections and allergies most often bring on these attacks. Smoking, air pollution, cold air, emotional upsets, and vigorous exercise can also bring them on.    TREATMENT     Treatment is aimed at making the narrowed airways larger. Mild asthma/bronchospasm is usually controlled with inhaled medicines. Albuterol is a common medicine that you breathe in to open spastic or narrowed airways. Some trade names for albuterol are Ventolin or Proventil. Steroid medicine is also used to reduce the inflammation when an attack is moderate or severe. Antibiotics (medications used to kill germs) are only used if a bacterial infection is present.    If you are pregnant and need to use Albuterol (Ventolin or Proventil), you can expect the baby to move more than usual shortly after the medicine is used.   HOME CARE INSTRUCTIONS     Rest.    Drink plenty of liquids. This helps the mucus to remain thin and easily coughed up. Do not use caffeine or alcohol.    Do not smoke. Avoid being exposed to second-hand smoke.    You play a critical role in keeping yourself in good health. Avoid exposure to things that cause you to wheeze. Avoid exposure to things that cause you to have breathing problems. Keep your medications up-to-date and available. Carefully follow your doctor's treatment plan.    When pollen or pollution is bad, keep windows closed and use an air conditioner go to places with air conditioning. If you are allergic to furry pets or birds, find new homes for them or keep them outside.    Take your medicine exactly as prescribed.     Asthma requires careful medical attention. See your caregiver for follow-up as advised. If you are more than [redacted] weeks pregnant and you were prescribed any new medications, let your Obstetrician know about the visit and how you are doing. Arrange a recheck.   SEEK IMMEDIATE MEDICAL CARE IF:     You are getting worse.    You have trouble breathing. If severe, call 911.    You develop chest pain or discomfort.    You are throwing up or not drinking fluids.    You are not getting better within 24 hours.    You are coughing up yellow, green, brown, or bloody sputum.    You develop a fever over 102 F (38.9 C).    You have trouble swallowing.   MAKE SURE YOU:     Understand these instructions.    Will watch your condition.    Will get help right away if you are not doing well or get worse.   Document Released: 10/29/2006 Document Revised: 07/03/2011 Document Reviewed: 06/28/2007  [employer] Patient Information 2012 [employer]

## 2012-03-26 ENCOUNTER — Emergency Department (HOSPITAL_BASED_OUTPATIENT_CLINIC_OR_DEPARTMENT_OTHER)
Admission: EM | Admit: 2012-03-26 | Discharge: 2012-03-26 | Disposition: A | Discharge status: Home / Self Care | Payer: 59 | Attending: Emergency Medicine | Admitting: Emergency Medicine

## 2012-03-26 ENCOUNTER — Encounter (HOSPITAL_BASED_OUTPATIENT_CLINIC_OR_DEPARTMENT_OTHER): Payer: Self-pay | Admitting: Emergency Medicine

## 2012-03-26 DIAGNOSIS — J029 Acute pharyngitis, unspecified: Secondary | ICD-10-CM

## 2012-03-26 DIAGNOSIS — J45909 Unspecified asthma, uncomplicated: Secondary | ICD-10-CM | POA: Insufficient documentation

## 2012-03-26 DIAGNOSIS — R070 Pain in throat: Secondary | ICD-10-CM | POA: Insufficient documentation

## 2012-03-26 DIAGNOSIS — F17200 Nicotine dependence, unspecified, uncomplicated: Secondary | ICD-10-CM | POA: Insufficient documentation

## 2012-03-26 DIAGNOSIS — F909 Attention-deficit hyperactivity disorder, unspecified type: Secondary | ICD-10-CM | POA: Insufficient documentation

## 2012-03-26 MED ORDER — AMOXICILLIN 500 MG PO CAPS: 1000.0000 mg | ORAL_CAPSULE | Freq: Every day | ORAL | Status: AC

## 2012-03-26 MED ORDER — ALBUTEROL SULFATE HFA 108 (90 BASE) MCG/ACT IN AERS: 2.0000 | INHALATION_SPRAY | RESPIRATORY_TRACT | Status: AC | PRN

## 2012-03-26 NOTE — ED Notes (Signed)
Pt has been taking ibuprofen without relief of pain. Also states he took two leftover amoxicillin capsules yesterday morning without relief.

## 2012-03-26 NOTE — ED Notes (Signed)
sore throat x 2 days

## 2012-03-26 NOTE — ED Provider Notes (Signed)
History     CSN: [id number]  Arrival date & time 03/26/12  0128   First MD Initiated Contact with Patient 03/26/12 0214      Chief Complaint  Patient presents with  . Sore Throat    (Consider location/radiation/quality/duration/timing/severity/associated sxs/prior treatment) HPI This is a 26 year old white male who states he gets strep throat about every 6 months. He is here with a two-day history of sore throat, worse with swallowing. He has had a fever to 101 but has kept this under control with ibuprofen. He denies rash. He denies nausea or vomiting. He does have some sore lymph nodes in his anterior neck.  Past Medical History  Diagnosis Date  . Asthma   . ADHD (attention deficit hyperactivity disorder)   . TBI (traumatic brain injury)   . Subdural hemorrhage   . right frontal calvarial fracture extending to right sinuses   . Splenic laceration   . Acute blood loss anemia     History reviewed. No pertinent past surgical history.  Family History  Problem Relation Age of Onset  . Diabetes    . Coronary artery disease      History  Substance Use Topics  . Smoking status: Current Everyday Smoker  . Smokeless tobacco: Not on file  . Alcohol Use: No      Review of Systems  All other systems reviewed and are negative.    Allergies  Review of patient's allergies indicates no active allergies.  Home Medications   Current Outpatient Rx  Name Route Sig Dispense Refill  . ALBUTEROL SULFATE HFA 108 (90 BASE) MCG/ACT IN AERS Inhalation Inhale 1-2 puffs into the lungs every 6 (six) hours as needed for wheezing. 1 Inhaler 0  . ALBUTEROL SULFATE (2.5 MG/3ML) 0.083% IN NEBU Nebulization Take 3 mLs (2.5 mg total) by nebulization every 6 (six) hours as needed for wheezing. 25 mL 2  . ALBUTEROL IN Inhalation Inhale into the lungs.    . AMANTADINE HCL 100 MG PO CAPS Oral Take 100 mg by mouth 2 (two) times daily.    Marland Kitchen GABAPENTIN PO Oral Take by mouth.    Marland Kitchen PREDNISONE 10 MG  PO TABS  20 mg 2 (two) times daily.      BP 140/74  Pulse 108  Temp 99.2 F (37.3 C) (Oral)  Resp 18  Ht 6\' 2"  (1.88 m)  Wt 190 lb (86.183 kg)  BMI 24.39 kg/m2  SpO2 97%  Physical Exam General: Well-developed, well-nourished male in no acute distress; appearance consistent with age of record HENT: normocephalic, atraumatic; pharyngeal erythema; uvula midline Eyes: pupils equal round and reactive to light; extraocular muscles intact Neck: supple; mild anterior cervical lymphadenopathy Heart: regular rate and rhythm Lungs: clear to auscultation bilaterally Abdomen: soft; nondistended Extremities: No deformity; full range of motion Neurologic: Awake, alert and oriented; motor function intact in all extremities and symmetric; no facial droop Skin: Warm and dry Psychiatric: Normal mood and affect    ED Course  Procedures (including critical care time)     MDM  In light of patient's history of frequent episodes of strep pharyngitis we will treat presumptively.        Hanley Seamen, MD 03/26/12 [phone]

## 2012-07-27 ENCOUNTER — Emergency Department (HOSPITAL_BASED_OUTPATIENT_CLINIC_OR_DEPARTMENT_OTHER)
Admission: EM | Admit: 2012-07-27 | Discharge: 2012-07-27 | Disposition: A | Discharge status: Home / Self Care | Payer: 59 | Attending: Emergency Medicine | Admitting: Emergency Medicine

## 2012-07-27 ENCOUNTER — Emergency Department (HOSPITAL_BASED_OUTPATIENT_CLINIC_OR_DEPARTMENT_OTHER): Payer: 59

## 2012-07-27 ENCOUNTER — Encounter (HOSPITAL_BASED_OUTPATIENT_CLINIC_OR_DEPARTMENT_OTHER): Payer: Self-pay | Admitting: *Deleted

## 2012-07-27 DIAGNOSIS — Z79899 Other long term (current) drug therapy: Secondary | ICD-10-CM | POA: Insufficient documentation

## 2012-07-27 DIAGNOSIS — R059 Cough, unspecified: Secondary | ICD-10-CM | POA: Insufficient documentation

## 2012-07-27 DIAGNOSIS — R062 Wheezing: Secondary | ICD-10-CM | POA: Insufficient documentation

## 2012-07-27 DIAGNOSIS — R5381 Other malaise: Secondary | ICD-10-CM | POA: Insufficient documentation

## 2012-07-27 DIAGNOSIS — F17200 Nicotine dependence, unspecified, uncomplicated: Secondary | ICD-10-CM | POA: Insufficient documentation

## 2012-07-27 DIAGNOSIS — Z862 Personal history of diseases of the blood and blood-forming organs and certain disorders involving the immune mechanism: Secondary | ICD-10-CM | POA: Insufficient documentation

## 2012-07-27 DIAGNOSIS — F909 Attention-deficit hyperactivity disorder, unspecified type: Secondary | ICD-10-CM | POA: Insufficient documentation

## 2012-07-27 DIAGNOSIS — R05 Cough: Secondary | ICD-10-CM | POA: Insufficient documentation

## 2012-07-27 DIAGNOSIS — J3489 Other specified disorders of nose and nasal sinuses: Secondary | ICD-10-CM | POA: Insufficient documentation

## 2012-07-27 DIAGNOSIS — J189 Pneumonia, unspecified organism: Secondary | ICD-10-CM | POA: Insufficient documentation

## 2012-07-27 DIAGNOSIS — Z8781 Personal history of (healed) traumatic fracture: Secondary | ICD-10-CM | POA: Insufficient documentation

## 2012-07-27 DIAGNOSIS — J45901 Unspecified asthma with (acute) exacerbation: Secondary | ICD-10-CM | POA: Insufficient documentation

## 2012-07-27 DIAGNOSIS — R0602 Shortness of breath: Secondary | ICD-10-CM | POA: Insufficient documentation

## 2012-07-27 MED ORDER — ACETAMINOPHEN 500 MG PO TABS
1000.0000 mg | ORAL_TABLET | Freq: Once | ORAL | Status: AC
  Administered 2012-07-27: 1000 mg via ORAL

## 2012-07-27 MED ORDER — IPRATROPIUM BROMIDE 0.02 % IN SOLN
0.5000 mg | RESPIRATORY_TRACT | Status: DC
  Administered 2012-07-27: 0.5 mg via RESPIRATORY_TRACT
  Filled 2012-07-27: qty 2.5

## 2012-07-27 MED ORDER — AZITHROMYCIN 250 MG PO TABS: 250.0000 mg | ORAL_TABLET | Freq: Every day | ORAL | Status: DC

## 2012-07-27 MED ORDER — PREDNISONE 10 MG PO TABS
60.0000 mg | ORAL_TABLET | Freq: Once | ORAL | Status: AC
  Administered 2012-07-27: 60 mg via ORAL
  Filled 2012-07-27: qty 1

## 2012-07-27 MED ORDER — IBUPROFEN 400 MG PO TABS
400.0000 mg | ORAL_TABLET | Freq: Once | ORAL | Status: AC
  Administered 2012-07-27: 400 mg via ORAL
  Filled 2012-07-27: qty 1

## 2012-07-27 MED ORDER — PREDNISONE 10 MG PO TABS: 40.0000 mg | ORAL_TABLET | Freq: Every day | ORAL | Status: DC

## 2012-07-27 MED ORDER — ACETAMINOPHEN 500 MG PO TABS
ORAL_TABLET | ORAL | Status: AC
  Filled 2012-07-27: qty 2

## 2012-07-27 MED ORDER — IBUPROFEN 400 MG PO TABS: 400.0000 mg | ORAL_TABLET | Freq: Once | ORAL | Status: DC

## 2012-07-27 MED ORDER — ALBUTEROL SULFATE (5 MG/ML) 0.5% IN NEBU
2.5000 mg | INHALATION_SOLUTION | RESPIRATORY_TRACT | Status: DC
  Administered 2012-07-27: 2.5 mg via RESPIRATORY_TRACT
  Filled 2012-07-27: qty 0.5

## 2012-07-27 NOTE — ED Notes (Signed)
Pt with cough fever and shortness of breath since Sat 02 sats decreased in triage pt also tachycardic
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
## 2012-11-20 ENCOUNTER — Encounter (HOSPITAL_BASED_OUTPATIENT_CLINIC_OR_DEPARTMENT_OTHER): Payer: Self-pay | Admitting: Emergency Medicine

## 2012-11-20 ENCOUNTER — Emergency Department (HOSPITAL_BASED_OUTPATIENT_CLINIC_OR_DEPARTMENT_OTHER): Payer: 59

## 2012-11-20 ENCOUNTER — Emergency Department (HOSPITAL_BASED_OUTPATIENT_CLINIC_OR_DEPARTMENT_OTHER)
Admission: EM | Admit: 2012-11-20 | Discharge: 2012-11-20 | Disposition: A | Discharge status: Home / Self Care | Payer: 59 | Attending: Emergency Medicine | Admitting: Emergency Medicine

## 2012-11-20 DIAGNOSIS — Z8719 Personal history of other diseases of the digestive system: Secondary | ICD-10-CM | POA: Insufficient documentation

## 2012-11-20 DIAGNOSIS — M79642 Pain in left hand: Secondary | ICD-10-CM

## 2012-11-20 DIAGNOSIS — Z8669 Personal history of other diseases of the nervous system and sense organs: Secondary | ICD-10-CM | POA: Insufficient documentation

## 2012-11-20 DIAGNOSIS — F17200 Nicotine dependence, unspecified, uncomplicated: Secondary | ICD-10-CM | POA: Insufficient documentation

## 2012-11-20 DIAGNOSIS — Z79899 Other long term (current) drug therapy: Secondary | ICD-10-CM | POA: Insufficient documentation

## 2012-11-20 DIAGNOSIS — M79609 Pain in unspecified limb: Secondary | ICD-10-CM | POA: Insufficient documentation

## 2012-11-20 DIAGNOSIS — Z8781 Personal history of (healed) traumatic fracture: Secondary | ICD-10-CM | POA: Insufficient documentation

## 2012-11-20 DIAGNOSIS — R209 Unspecified disturbances of skin sensation: Secondary | ICD-10-CM | POA: Insufficient documentation

## 2012-11-20 DIAGNOSIS — Z862 Personal history of diseases of the blood and blood-forming organs and certain disorders involving the immune mechanism: Secondary | ICD-10-CM | POA: Insufficient documentation

## 2012-11-20 DIAGNOSIS — Z87820 Personal history of traumatic brain injury: Secondary | ICD-10-CM | POA: Insufficient documentation

## 2012-11-20 DIAGNOSIS — J45909 Unspecified asthma, uncomplicated: Secondary | ICD-10-CM | POA: Insufficient documentation

## 2012-11-20 MED ORDER — HYDROCODONE-ACETAMINOPHEN 5-325 MG PO TABS: 1.0000 | ORAL_TABLET | Freq: Four times a day (QID) | ORAL | Status: DC | PRN

## 2012-11-20 MED ORDER — NAPROXEN 500 MG PO TABS: 500.0000 mg | ORAL_TABLET | Freq: Two times a day (BID) | ORAL | Status: DC

## 2012-11-20 NOTE — ED Notes (Signed)
Pt has bilateral wrist pain for several months.  Pt noticed raised area on left wrist this week.  Pt states it is very painful
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
## 2015-09-24 ENCOUNTER — Telehealth: Payer: Self-pay | Admitting: *Deleted

## 2015-09-24 NOTE — Telephone Encounter (Signed)
Mr Stanfield notified.
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
## 2015-09-24 NOTE — Telephone Encounter (Signed)
He should have no problems going on a plane at this point

## 2017-04-04 ENCOUNTER — Emergency Department (HOSPITAL_BASED_OUTPATIENT_CLINIC_OR_DEPARTMENT_OTHER)
Admission: EM | Admit: 2017-04-04 | Discharge: 2017-04-04 | Disposition: A | Discharge status: Home / Self Care | Payer: 59 | Attending: Emergency Medicine | Admitting: Emergency Medicine

## 2017-04-04 ENCOUNTER — Encounter (HOSPITAL_BASED_OUTPATIENT_CLINIC_OR_DEPARTMENT_OTHER): Payer: Self-pay

## 2017-04-04 DIAGNOSIS — M2540 Effusion, unspecified joint: Secondary | ICD-10-CM

## 2017-04-04 DIAGNOSIS — L989 Disorder of the skin and subcutaneous tissue, unspecified: Secondary | ICD-10-CM

## 2017-04-04 DIAGNOSIS — R2243 Localized swelling, mass and lump, lower limb, bilateral: Secondary | ICD-10-CM | POA: Diagnosis present

## 2017-04-04 DIAGNOSIS — Z79899 Other long term (current) drug therapy: Secondary | ICD-10-CM | POA: Insufficient documentation

## 2017-04-04 DIAGNOSIS — J45909 Unspecified asthma, uncomplicated: Secondary | ICD-10-CM | POA: Diagnosis not present

## 2017-04-04 DIAGNOSIS — F17200 Nicotine dependence, unspecified, uncomplicated: Secondary | ICD-10-CM | POA: Insufficient documentation

## 2017-04-04 DIAGNOSIS — R21 Rash and other nonspecific skin eruption: Secondary | ICD-10-CM | POA: Diagnosis not present

## 2017-04-04 LAB — COMPREHENSIVE METABOLIC PANEL
ALBUMIN: 4.1 g/dL (ref 3.5–5.0)
ALK PHOS: 62 U/L (ref 38–126)
ALT: 51 U/L (ref 17–63)
AST: 27 U/L (ref 15–41)
Anion gap: 7 (ref 5–15)
BILIRUBIN TOTAL: 0.4 mg/dL (ref 0.3–1.2)
BUN: 18 mg/dL (ref 6–20)
CALCIUM: 8.9 mg/dL (ref 8.9–10.3)
CO2: 28 mmol/L (ref 22–32)
Chloride: 107 mmol/L (ref 101–111)
Creatinine, Ser: 0.88 mg/dL (ref 0.61–1.24)
GFR calc Af Amer: >60 mL/min (ref >60)
GFR calc non Af Amer: >60 mL/min (ref >60)
GLUCOSE: 127 mg/dL — AB (ref 65–99)
Potassium: 3.8 mmol/L (ref 3.5–5.1)
Sodium: 142 mmol/L (ref 135–145)
TOTAL PROTEIN: 7.1 g/dL (ref 6.5–8.1)

## 2017-04-04 LAB — CBC WITH DIFFERENTIAL/PLATELET
BASOS ABS: 0 10*3/uL (ref 0.0–0.1)
BASOS PCT: 0 %
EOS PCT: 4 %
Eosinophils Absolute: 0.3 10*3/uL (ref 0.0–0.7)
HCT: 49.2 % (ref 39.0–52.0)
Hemoglobin: 16.3 g/dL (ref 13.0–17.0)
Lymphocytes Relative: 29 %
Lymphs Abs: 2 10*3/uL (ref 0.7–4.0)
MCH: 29 pg (ref 26.0–34.0)
MCHC: 33.1 g/dL (ref 30.0–36.0)
MCV: 87.4 fL (ref 78.0–100.0)
MONO ABS: 0.5 10*3/uL (ref 0.1–1.0)
Monocytes Relative: 7 %
Neutro Abs: 4.1 10*3/uL (ref 1.7–7.7)
Neutrophils Relative %: 60 %
Platelets: 244 10*3/uL (ref 150–400)
RBC: 5.63 MIL/uL (ref 4.22–5.81)
RDW: 14.1 % (ref 11.5–15.5)
WBC: 6.8 10*3/uL (ref 4.0–10.5)

## 2017-04-04 MED ORDER — DOXYCYCLINE HYCLATE 100 MG PO CAPS: 100.0000 mg | ORAL_CAPSULE | Freq: Two times a day (BID) | ORAL | 0 refills | Status: DC
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
## 2017-04-04 NOTE — ED Triage Notes (Signed)
Pt recently came back from [place] and noticed bilateral ankle swelling, was able to reduce swelling by putting feet up.  Was working all day yesterday and they swelled again, was again able to reduce swelling with elevation.  Same thing happened after work tonight, wanted it to get checked out.  Pt denies SOB, no chest pain, no calf pain or groin pain, tonight the right ankle is effected.  Swelling has currently gone down significantly, no edema appreciated upon exam in triage.

## 2017-12-08 ENCOUNTER — Emergency Department (HOSPITAL_BASED_OUTPATIENT_CLINIC_OR_DEPARTMENT_OTHER)
Admission: EM | Admit: 2017-12-08 | Discharge: 2017-12-08 | Disposition: A | Discharge status: Home / Self Care | Attending: Emergency Medicine | Admitting: Emergency Medicine

## 2017-12-08 ENCOUNTER — Other Ambulatory Visit: Payer: Self-pay

## 2017-12-08 ENCOUNTER — Encounter (HOSPITAL_BASED_OUTPATIENT_CLINIC_OR_DEPARTMENT_OTHER): Payer: Self-pay | Admitting: Emergency Medicine

## 2017-12-08 DIAGNOSIS — Z4800 Encounter for change or removal of nonsurgical wound dressing: Secondary | ICD-10-CM | POA: Insufficient documentation

## 2017-12-08 DIAGNOSIS — F17200 Nicotine dependence, unspecified, uncomplicated: Secondary | ICD-10-CM | POA: Insufficient documentation

## 2017-12-08 DIAGNOSIS — Z5189 Encounter for other specified aftercare: Secondary | ICD-10-CM

## 2017-12-08 DIAGNOSIS — J45909 Unspecified asthma, uncomplicated: Secondary | ICD-10-CM | POA: Insufficient documentation

## 2017-12-08 MED ORDER — MUPIROCIN CALCIUM 2 % EX CREA: 1.0000 "application " | TOPICAL_CREAM | Freq: Two times a day (BID) | CUTANEOUS | 0 refills | Status: AC
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
## 2017-12-08 NOTE — ED Triage Notes (Signed)
Pt states he sustained an abrasion to dorsum of R hand on biohazard bin at work on 12/03/17. Redness to wound, pt is concerned about infection. Denies drainage from wound or other sx.

## 2018-09-15 ENCOUNTER — Other Ambulatory Visit: Payer: Self-pay

## 2018-09-15 ENCOUNTER — Encounter (HOSPITAL_BASED_OUTPATIENT_CLINIC_OR_DEPARTMENT_OTHER): Payer: Self-pay | Admitting: Emergency Medicine

## 2018-09-15 ENCOUNTER — Emergency Department (HOSPITAL_BASED_OUTPATIENT_CLINIC_OR_DEPARTMENT_OTHER)
Admission: EM | Admit: 2018-09-15 | Discharge: 2018-09-15 | Disposition: A | Discharge status: Home / Self Care | Attending: Emergency Medicine | Admitting: Emergency Medicine

## 2018-09-15 DIAGNOSIS — Z79899 Other long term (current) drug therapy: Secondary | ICD-10-CM | POA: Insufficient documentation

## 2018-09-15 DIAGNOSIS — J45909 Unspecified asthma, uncomplicated: Secondary | ICD-10-CM | POA: Insufficient documentation

## 2018-09-15 DIAGNOSIS — Z578 Occupational exposure to other risk factors: Secondary | ICD-10-CM | POA: Diagnosis not present

## 2018-09-15 NOTE — ED Provider Notes (Signed)
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  He had human blood serum splashed on the right side of his face while at work this morning about 2 AM.  He was wearing goggles which protected his eyes.  His mouth was shut and not injured his mouth.  He immediately washed his face with alcohol.  He has no open sores on his face.  He is here for baseline post exposure antibody testing.   Past Medical History:  Diagnosis Date  . Acute blood loss anemia   . ADHD (attention deficit hyperactivity disorder)   . Asthma   . right frontal calvarial fracture extending to right sinuses   . Splenic laceration   . Subdural hemorrhage (HCC)   . TBI (traumatic brain injury) [facility])     History reviewed. No pertinent surgical history.  Family History  Problem Relation Age of Onset  . Diabetes Other   . Coronary artery disease Other     Social History   Tobacco Use  . Smoking status: Never Smoker  . Smokeless tobacco: Never Used  Substance Use Topics  . Alcohol use: Yes  . Drug use: Not on file    Prior to Admission medications   Medication Sig Start Date End Date Taking? Authorizing Provider  albuterol (PROVENTIL HFA;VENTOLIN HFA) 108 (90 Base) MCG/ACT inhaler Inhale 2 puffs every 4 hours as needed for coughing, wheezing, shortness of breath, chest tightness 08/06/18  Yes [provider]  albuterol (PROVENTIL HFA;VENTOLIN HFA) 108 (90 BASE) MCG/ACT inhaler Inhale 2 puffs into the lungs every 4 (four) hours as needed for wheezing. 03/26/12 03/26/13  Galya Dunnigan, Jonny Ruiz, MD  albuterol (PROVENTIL) (2.5 MG/3ML) 0.083% nebulizer solution Take 3 mLs (2.5 mg total) by nebulization every 6 (six) hours as needed for wheezing. 01/27/12 01/26/13  Geoffery Lyons,  MD  doxycycline (VIBRAMYCIN) 100 MG capsule Take 1 capsule (100 mg total) by mouth 2 (two) times daily. One po bid x 7 days 04/04/17   Zadie Rhine, MD  fluticasone [facility] HFA) 220 MCG/ACT inhaler Inhale into the lungs 2 (two) times daily.    [provider]  HYDROcodone-acetaminophen (NORCO/VICODIN) 5-325 MG per tablet Take 1-2 tablets by mouth every 6 (six) hours as needed for pain. 11/20/12   Vanetta Mulders, MD  mupirocin cream (BACTROBAN) 2 % Apply 1 application topically 2 (two) times daily. 12/08/17   Palumbo, April, MD  naproxen (NAPROSYN) 500 MG tablet Take 1 tablet (500 mg total) by mouth 2 (two) times daily. 11/20/12   Vanetta Mulders, MD    Allergies Patient has no known allergies.   REVIEW OF SYSTEMS  Negative except as noted here or in the History of Present Illness.   PHYSICAL EXAMINATION  Initial Vital Signs Blood pressure 131/89, pulse 70, temperature 98.2 F (36.8 C), temperature source Oral, resp. rate 12, SpO2 100 %.  Examination General: Well-developed, well-nourished male in no acute distress; appearance consistent with age of record HENT: normocephalic; atraumatic Eyes: pupils equal, round and reactive to light; extraocular muscles intact Neck: supple Heart: regular rate and rhythm Lungs: clear to auscultation bilaterally Abdomen: soft; nondistended Extremities: No deformity; full range of motion Neurologic: Awake, alert and oriented; motor function intact in all extremities and symmetric; no facial  droop Skin: Warm and dry Psychiatric: Normal mood and affect   RESULTS  Summary of this visit's results, reviewed by myself:   EKG Interpretation  Date/Time:    Ventricular Rate:    PR Interval:    QRS Duration:   QT Interval:    QTC Calculation:   R Axis:     Text Interpretation:        Laboratory Studies: No results found for this or any previous visit (from the past 24 hour(s)). Imaging Studies: No results found.  ED COURSE and  MDM  Nursing notes and initial vitals signs, including pulse oximetry, reviewed.  Vitals:   09/15/18 0256  BP: 131/89  Pulse: 70  Resp: 12  Temp: 98.2 F (36.8 C)  TempSrc: Oral  SpO2: 100%   Per the "Updated [place] Public Health Service Guidelines for the Management of Occupational Exposures to HBV, HCV, and HIV and Recommendations for Postexposure Prophylaxis" on the CDC.gov website:  "An exposure that might place HCP at risk for HBV, HCV, or HIV infection is defined as a percutaneous injury (e.g., a needlestick or cut with a sharp object) or contact of mucous membrane or nonintact skin (e.g., exposed skin that is chapped, abraded, or afflicted with dermatitis) with blood, tissue, or other body fluids that are potentially infectious."   The patient does not meet any of these criteria and therefore postexposure prophylaxis is not recommended.  Baseline hepatitis B, hepatitis C and HIV blood testing will be performed as requested by his employer.  PROCEDURES    ED DIAGNOSES     ICD-10-CM   1. Employee exposure to body fluids Z57.8        Ordean Fouts, Jonny Ruiz, MD 09/15/18 [phone]

## 2018-09-15 NOTE — ED Triage Notes (Addendum)
Pt reports exposure to human blood at 0200 this morning at work, reports he was wearing goggles. States he got blood on his mouth and cheek but mouth was closed. Works at [employer].

## 2018-09-16 LAB — HEPATITIS C ANTIBODY

## 2018-09-16 LAB — HEPATITIS B SURFACE ANTIGEN: Hepatitis B Surface Ag: NEGATIVE

## 2018-09-16 LAB — HIV ANTIBODY (ROUTINE TESTING W REFLEX): HIV SCREEN 4TH GENERATION: NONREACTIVE
